# Patient Record
Sex: Female | Born: 1940 | Race: White | Hispanic: No | Marital: Married | Smoking: Former smoker
Health system: Southern US, Community
[De-identification: ages and names within clinical notes are randomized; demographics above are authoritative.]

## PROBLEM LIST (undated history)

## (undated) DIAGNOSIS — M1990 Unspecified osteoarthritis, unspecified site: Secondary | ICD-10-CM

## (undated) DIAGNOSIS — D649 Anemia, unspecified: Secondary | ICD-10-CM

## (undated) DIAGNOSIS — E119 Type 2 diabetes mellitus without complications: Secondary | ICD-10-CM

## (undated) DIAGNOSIS — I10 Essential (primary) hypertension: Secondary | ICD-10-CM

## (undated) DIAGNOSIS — K219 Gastro-esophageal reflux disease without esophagitis: Secondary | ICD-10-CM

## (undated) DIAGNOSIS — E079 Disorder of thyroid, unspecified: Secondary | ICD-10-CM

## (undated) DIAGNOSIS — Z5189 Encounter for other specified aftercare: Secondary | ICD-10-CM

## (undated) HISTORY — DX: Essential (primary) hypertension: I10

## (undated) HISTORY — PX: CARDIAC CATHETERIZATION: SHX172

## (undated) HISTORY — PX: TUBAL LIGATION: SHX77

## (undated) HISTORY — DX: Encounter for other specified aftercare: Z51.89

## (undated) HISTORY — PX: ABDOMINAL HYSTERECTOMY: SHX81

## (undated) HISTORY — DX: Type 2 diabetes mellitus without complications: E11.9

## (undated) HISTORY — DX: Disorder of thyroid, unspecified: E07.9

## (undated) HISTORY — PX: CHOLECYSTECTOMY: SHX55

## (undated) HISTORY — PX: CARDIAC ELECTROPHYSIOLOGY MAPPING AND ABLATION: SHX1292

## (undated) HISTORY — PX: WISDOM TOOTH EXTRACTION: SHX21

## (undated) HISTORY — DX: Anemia, unspecified: D64.9

## (undated) HISTORY — DX: Unspecified osteoarthritis, unspecified site: M19.90

## (undated) HISTORY — DX: Gastro-esophageal reflux disease without esophagitis: K21.9

## (undated) HISTORY — PX: REPLACEMENT TOTAL KNEE: SUR1224

---

## 2005-03-12 ENCOUNTER — Ambulatory Visit (HOSPITAL_COMMUNITY): Admission: RE | Admit: 2005-03-12 | Discharge: 2005-03-12 | Payer: Self-pay | Admitting: Family Medicine

## 2005-03-19 ENCOUNTER — Ambulatory Visit (HOSPITAL_COMMUNITY): Admission: RE | Admit: 2005-03-19 | Discharge: 2005-03-19 | Payer: Self-pay | Admitting: Family Medicine

## 2005-04-01 ENCOUNTER — Inpatient Hospital Stay (HOSPITAL_COMMUNITY): Admission: RE | Admit: 2005-04-01 | Discharge: 2005-04-05 | Payer: Self-pay | Admitting: Orthopedic Surgery

## 2006-01-25 ENCOUNTER — Emergency Department (HOSPITAL_COMMUNITY): Admission: EM | Admit: 2006-01-25 | Discharge: 2006-01-25 | Payer: Self-pay | Admitting: Emergency Medicine

## 2008-01-02 IMAGING — CT CT PELVIS W/ CM
1 of 2 series · 15 of 32 positions shown, 19 images · IV contrast (APPLIED)
Comparison: No comparison CT abdomen.

CLINICAL DATA: Abdominal pain.  Question ? diverticulitis?
ABDOMEN CT WITH CONTRAST:
TECHNIQUE: Multidetector CT imaging of the abdomen was performed following the standard protocol during bolus administration of intravenous contrast.
Contrast:  125 cc Omnipaque 300.
TECHNIQUE: Multidetector CT imaging of the pelvis was performed following the standard protocol during bolus administration of intravenous contrast.

[Series 2: abd_pel 5.0 b40f st · axial · 0.73mm/px · z∈[-360,+4]mm · 15 of 81 slices shown, 19 images]
[im 4/81  soft-tissue]
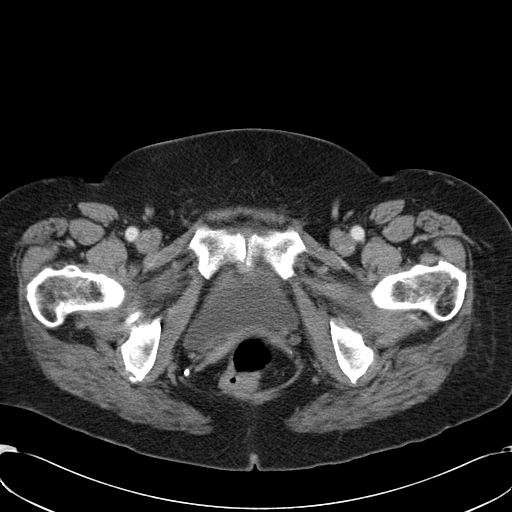
[im 4/81  bone]
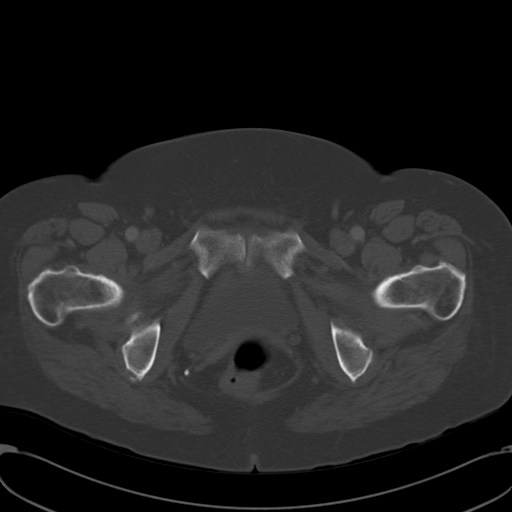
[im 11/81  soft-tissue]
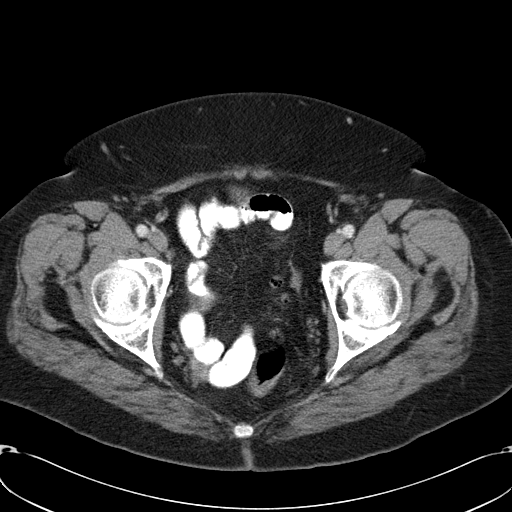
[im 18/81  soft-tissue]
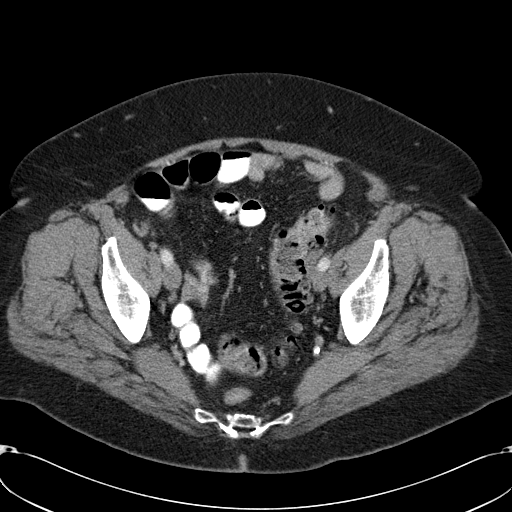
[im 21/81  soft-tissue]
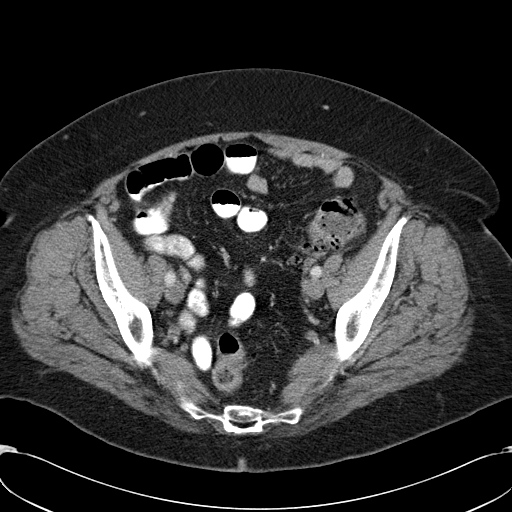
[im 28/81  soft-tissue]
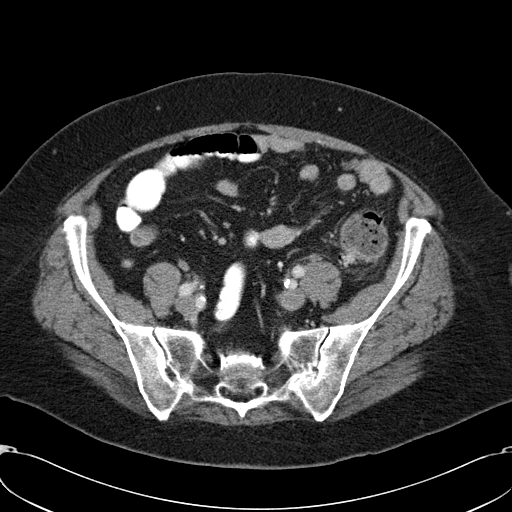
[im 35/81  soft-tissue]
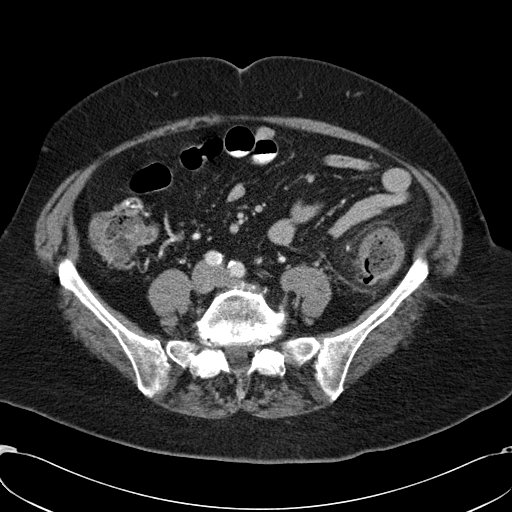
[im 42/81  soft-tissue]
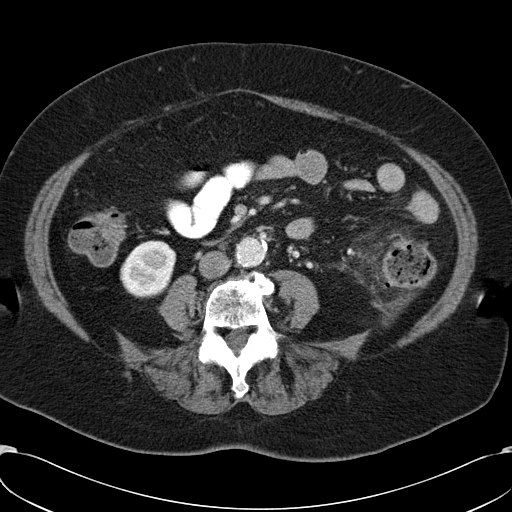
[im 46/81  soft-tissue]
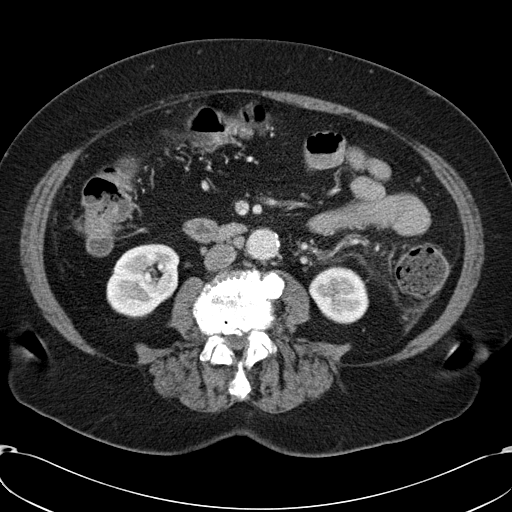
[im 53/81  soft-tissue]
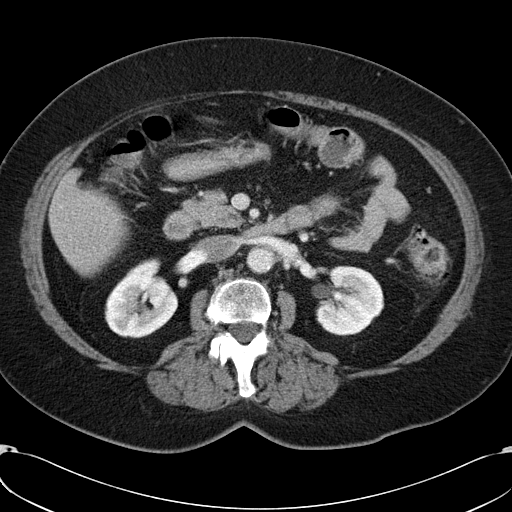
[im 53/81  bone]
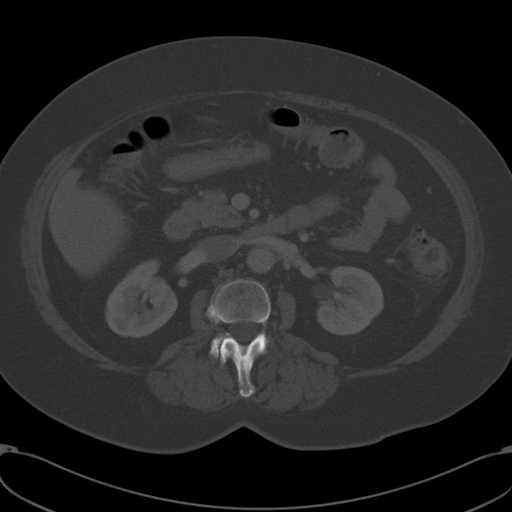
[im 60/81  soft-tissue]
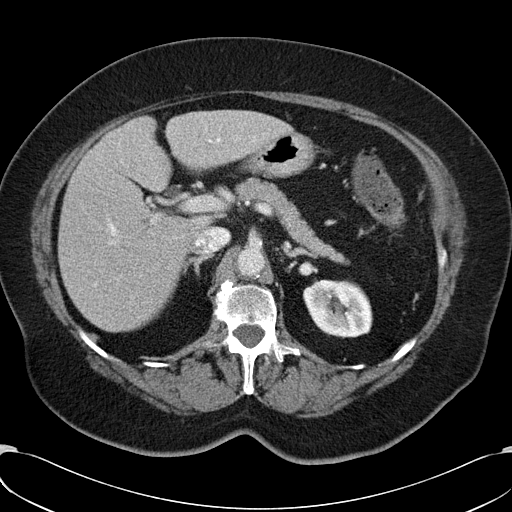
[im 63/81  soft-tissue]
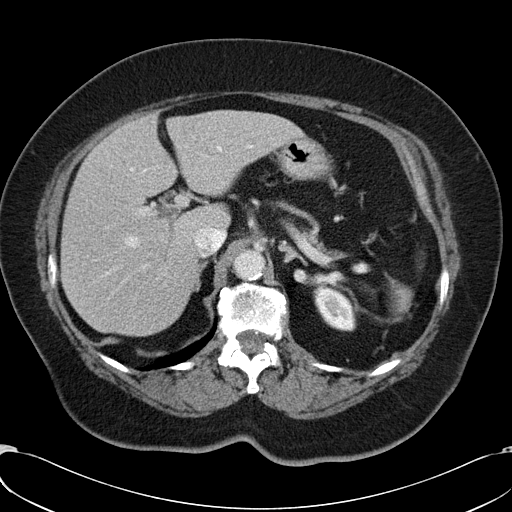
[im 67/81  lung]
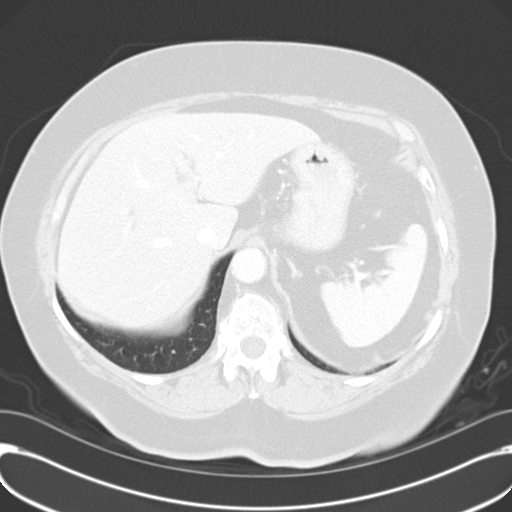
[im 70/81  soft-tissue]
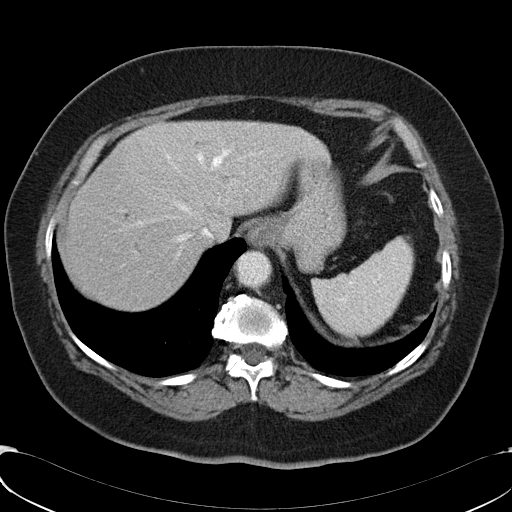
[im 70/81  lung]
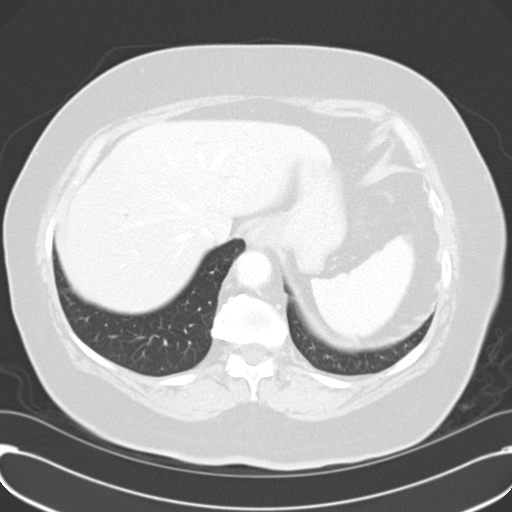
[im 74/81  lung]
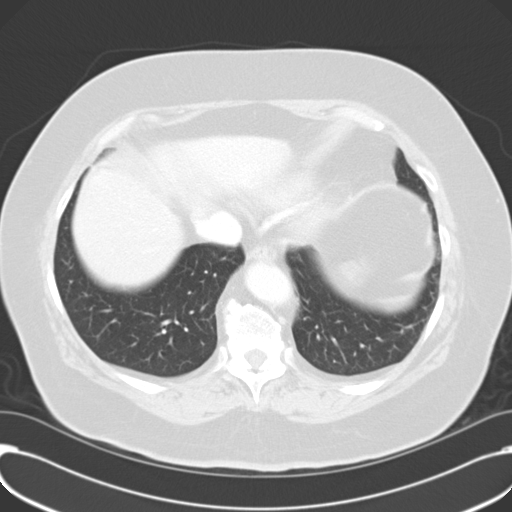
[im 77/81  soft-tissue]
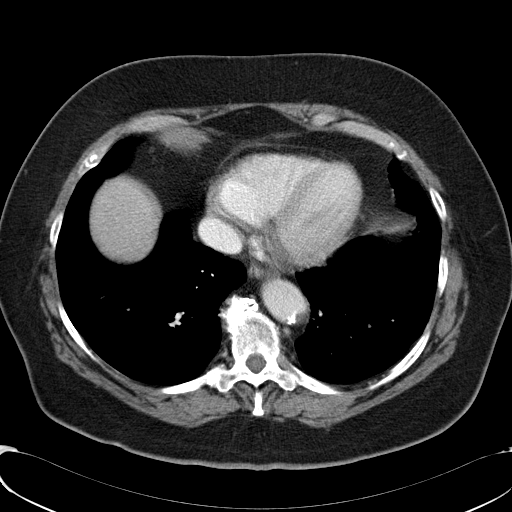
[im 77/81  lung]
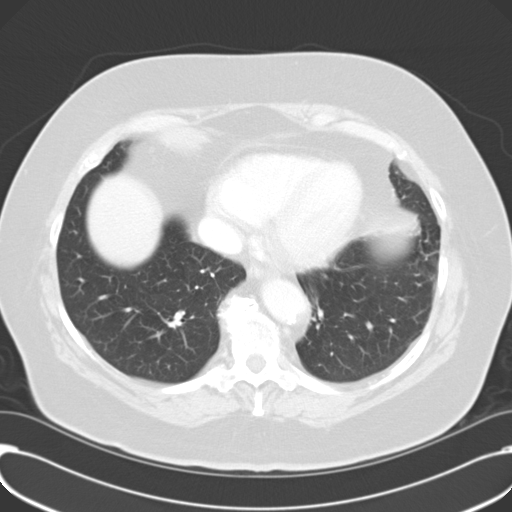

[15 of 32 positions shown; findings below may reference images not displayed]

FINDINGS: Right diaphragmatic fatty containing hernia.  Scarring in lingula.  Subcentimeter low-density lesions in the liver, too small to adequately characterize.  The largest measures 6.3 mm (image #18, series 2) and does not appear to be a simple cyst.  Stability can be confirmed on followup.  No pancreatic, adrenal, renal or splenic lesion.  Aorta is calcified and ectatic with maximal transverse dimension at the level of focal bulge measuring 2.5 x 2.4 cm.  Increased number of normal sized retroperitoneal lymph nodes.  Etiology or significance is indeterminate. 
Diffuse left colon inflammation consistent with diverticulitis without discrete underlying mass identified.  No well defined drainable abscess in the region of the inflammation.  Degenerative changes of the lumbar spine with spinal stenosis are most notable at the L3-4 level.
IMPRESSION: 1. Left colon prominent diverticulitis type changes without drainable abscess. 
2. Focal bulge, lower abdominal aorta, measuring 2.5 cm in maximal transverse dimension.  This is superimposed upon atherosclerotic type changes and ectasia of the abdominal aorta.  
3. Subcentimeter liver lesions of questionable etiology.  Stability can be confirmed on followup.  
4. Right diaphragmatic fatty containing hernia.
5. Increased number of normal sized retroperitoneal lymph nodes of questionable etiology. 
6. Spinal stenosis most notable at L3-4 level.
PELVIS CT WITH CONTRAST:
FINDINGS: Left colon diverticulitis type changes extend toward the pelvis.  No abscess collection.
IMPRESSION: Left colonic diverticulitis extends toward the pelvis but do not reach the level of the sigmoid colon.

## 2020-01-03 ENCOUNTER — Other Ambulatory Visit: Payer: Self-pay | Admitting: Hematology and Oncology

## 2020-01-10 ENCOUNTER — Encounter: Payer: Self-pay | Admitting: Oncology

## 2020-01-13 ENCOUNTER — Other Ambulatory Visit: Payer: Self-pay | Admitting: Oncology

## 2020-01-13 DIAGNOSIS — D509 Iron deficiency anemia, unspecified: Secondary | ICD-10-CM

## 2020-01-13 NOTE — Progress Notes (Signed)
[facility]  [address] [place],  [place]  [address] [phone]  Clinic Day:  01/14/2020  Referring physician: Herma Carson, MD   HISTORY OF PRESENT ILLNESS:  The patient is a 79 y.o. female  who I was asked to consult upon for iron deficiency anemia.  Labs in October 2021 showed a low hemoglobin of 8.7, with iron studies showing a low ferritin of 10, a low serum iron of 13, a TIBC of 436, and a low iron saturation of 2.9%.  She claims to have had a hemoglobin of only 7.2 a few months ago for which she required 2 units of blood.  She recalls being Hemoccult positive, but a recent EGD and colonoscopy did not reveal any obvious source of GI blood loss.  Scans also did not show a potential source/explanation for her iron deficiency anemia.  She denies having any overt forms of blood loss to explain her iron deficiency anemia.  She has been taking 2 iron pills daily for the past 4 weeks.  To her knowledge, there is no family history of anemia or other hematologic disorders.  PAST MEDICAL HISTORY:   Past Medical History:  Diagnosis Date  . Anemia   . Arthritis   . Blood transfusion without reported diagnosis   . Diabetes mellitus without complication (HCC)   . GERD (gastroesophageal reflux disease)   . Hypertension   . Thyroid disease    Atrial fibrillation, macular degeneration, osteoarthritis, hypercholesterolemia  PAST SURGICAL HISTORY:  Fight knee surgery, cholecystectomy, partial hysterectomy, bladder lift surgery    DossCURRENT MEDICATIONS:  Calcium carbonate, multivitamin,, vitamin D3, digoxin, docusate,  Ferrous sulfate, glipizide, levothyroxine, lisinopril, metformin, metoprolol, pioglitazone, rosuvastatin, sotalol, warfarin,    ALLERGIES:  No Known Allergies  FAMILY HISTORY:   Family History  Problem Relation Age of Onset  . Suicidality Mother        DIED FROM SUICIDE  . Alcoholism Father   . Drug abuse Sister        DIED FROM  SEPSIS    SOCIAL HISTORY:  The patient was born and raised in [place].  She lives in town with her husband of 44 years.  She has 6 children, 15 grandchildren, and 5 great grandchildren.  She was a bar maid, bank teller and house cleaner.  She smoked a pack of cigarettes daily x 52 years before quitting 15 years ago.  There is no history of alcohol abuse.    REVIEW OF SYSTEMS:  Review of Systems  Constitutional: Negative for fatigue and fever.  HENT:   Negative for hearing loss and sore throat.   Eyes: Negative for eye problems.  Respiratory: Negative for chest tightness, cough and hemoptysis.   Cardiovascular: Positive for palpitations. Negative for chest pain.  Gastrointestinal: Negative for abdominal distention, abdominal pain, blood in stool, constipation, diarrhea, nausea and vomiting.  Endocrine: Negative for hot flashes.  Genitourinary: Negative for difficulty urinating, dysuria, frequency, hematuria and nocturia.   Musculoskeletal: Positive for arthralgias (knees). Negative for back pain, gait problem and myalgias.  Skin: Positive for rash (sporadic skin lesions are over her abdomen). Negative for itching.  Neurological: Negative.  Negative for dizziness, extremity weakness, gait problem, headaches, light-headedness and numbness.  Hematological: Negative.   Psychiatric/Behavioral: Negative.  Negative for depression and suicidal ideas. The patient is not nervous/anxious.      PHYSICAL EXAM:  Blood pressure (!) 142/83, pulse 86, temperature 97.8 F (36.6 C), temperature source Oral, resp. rate 16, height 5'  7" (1.702 m), weight 223 lb (101.2 kg), SpO2 95 %. Wt Readings from Last 3 Encounters:  01/14/20 223 lb (101.2 kg)   Body mass index is 34.93 kg/m. Performance status (ECOG): 1 Physical Exam Constitutional:      Appearance: Normal appearance. She is not ill-appearing.  HENT:     Mouth/Throat:     Mouth: Mucous membranes are moist.     Pharynx: Oropharynx is clear. No  oropharyngeal exudate or posterior oropharyngeal erythema.  Cardiovascular:     Rate and Rhythm: Normal rate and regular rhythm.     Heart sounds: No murmur heard.  No friction rub. No gallop.   Pulmonary:     Effort: Pulmonary effort is normal. No respiratory distress.     Breath sounds: Normal breath sounds. No wheezing, rhonchi or rales.  Abdominal:     General: Bowel sounds are normal. There is no distension.     Palpations: Abdomen is soft. There is no mass.     Tenderness: There is no abdominal tenderness.  Musculoskeletal:        General: No swelling.     Right lower leg: No edema.     Left lower leg: No edema.  Lymphadenopathy:     Cervical: No cervical adenopathy.     Upper Body:     Right upper body: No supraclavicular or axillary adenopathy.     Left upper body: No supraclavicular or axillary adenopathy.     Lower Body: No right inguinal adenopathy. No left inguinal adenopathy.  Skin:    General: Skin is warm.     Coloration: Skin is not jaundiced.     Findings: No lesion or rash.  Neurological:     General: No focal deficit present.     Mental Status: She is alert and oriented to person, place, and time. Mental status is at baseline.     Cranial Nerves: Cranial nerves are intact.  Psychiatric:        Mood and Affect: Mood normal.        Behavior: Behavior normal.        Thought Content: Thought content normal.    LABS:    ASSESSMENT & PLAN:  A 79 y.o. female who I was asked to consult upon for iron deficiency anemia.  Her hemoglobin of 12.0 is clearly better than what it has been over the past few months.  As mentioned previously, she is taking 1-2 iron tablets daily.   I have no problem with her continuing to do this over these next few months to ensure adequate fortification iron stores.  As mentioned previously, she did have a recent GI workup which came back negative.  Clinically she is doing well.  I will see her back in  4 months for repeat clinical  assessment.  The patient understands all the plans discussed today and is in agreement with them.  I do appreciate Dr. Herma Carson for his new consult.   Johnney Scarlata Kirby Funk, MD

## 2020-01-14 ENCOUNTER — Inpatient Hospital Stay: Payer: Medicare HMO | Admitting: Hematology and Oncology

## 2020-01-14 ENCOUNTER — Inpatient Hospital Stay: Payer: Medicare HMO | Attending: Oncology | Admitting: Oncology

## 2020-01-14 ENCOUNTER — Telehealth: Payer: Self-pay | Admitting: Oncology

## 2020-01-14 ENCOUNTER — Encounter: Payer: Self-pay | Admitting: Oncology

## 2020-01-14 ENCOUNTER — Other Ambulatory Visit: Payer: Self-pay

## 2020-01-14 VITALS — BP 142/83 | HR 86 | Temp 97.8°F | Resp 16 | Ht 67.0 in | Wt 223.0 lb

## 2020-01-14 DIAGNOSIS — D509 Iron deficiency anemia, unspecified: Secondary | ICD-10-CM

## 2020-01-14 LAB — CBC: RBC: 4.49 (ref 3.87–5.11)

## 2020-01-14 LAB — CBC AND DIFFERENTIAL
HCT: 39 (ref 36–46)
Hemoglobin: 12 (ref 12.0–16.0)
Neutrophils Absolute: 2.15
Platelets: 172 (ref 150–399)
WBC: 4.3

## 2020-01-14 NOTE — Telephone Encounter (Signed)
Per 11/12 LOS schedule Labs, Follow up in March '22.  Gave patient appt

## 2020-01-16 ENCOUNTER — Other Ambulatory Visit: Payer: Self-pay | Admitting: Oncology

## 2020-01-16 DIAGNOSIS — D509 Iron deficiency anemia, unspecified: Secondary | ICD-10-CM

## 2020-05-10 ENCOUNTER — Inpatient Hospital Stay: Payer: Medicare HMO | Attending: Oncology

## 2020-05-12 ENCOUNTER — Inpatient Hospital Stay: Payer: Medicare HMO | Admitting: Oncology

## 2021-08-23 ENCOUNTER — Encounter (HOSPITAL_BASED_OUTPATIENT_CLINIC_OR_DEPARTMENT_OTHER): Payer: Self-pay | Admitting: Pediatrics

## 2021-08-23 ENCOUNTER — Emergency Department (HOSPITAL_BASED_OUTPATIENT_CLINIC_OR_DEPARTMENT_OTHER)
Admission: EM | Admit: 2021-08-23 | Discharge: 2021-08-23 | Disposition: A | Discharge status: Home / Self Care | Payer: Medicare HMO | Attending: Emergency Medicine | Admitting: Emergency Medicine

## 2021-08-23 ENCOUNTER — Other Ambulatory Visit: Payer: Self-pay

## 2021-08-23 DIAGNOSIS — K068 Other specified disorders of gingiva and edentulous alveolar ridge: Secondary | ICD-10-CM | POA: Diagnosis present

## 2021-08-23 DIAGNOSIS — Z7902 Long term (current) use of antithrombotics/antiplatelets: Secondary | ICD-10-CM | POA: Diagnosis not present

## 2021-08-23 DIAGNOSIS — I4891 Unspecified atrial fibrillation: Secondary | ICD-10-CM | POA: Insufficient documentation

## 2021-08-23 LAB — PROTIME-INR
INR: 1.2 (ref 0.8–1.2)
Prothrombin Time: 15 seconds (ref 11.4–15.2)

## 2021-08-23 LAB — CBC WITH DIFFERENTIAL/PLATELET
Abs Immature Granulocytes: 0.01 10*3/uL (ref 0.00–0.07)
Basophils Absolute: 0.1 10*3/uL (ref 0.0–0.1)
Basophils Relative: 1 %
Eosinophils Absolute: 0.4 10*3/uL (ref 0.0–0.5)
Eosinophils Relative: 5 %
HCT: 39.4 % (ref 36.0–46.0)
Hemoglobin: 12.7 g/dL (ref 12.0–15.0)
Immature Granulocytes: 0 %
Lymphocytes Relative: 25 %
Lymphs Abs: 1.8 10*3/uL (ref 0.7–4.0)
MCH: 29.7 pg (ref 26.0–34.0)
MCHC: 32.2 g/dL (ref 30.0–36.0)
MCV: 92.3 fL (ref 80.0–100.0)
Monocytes Absolute: 0.8 10*3/uL (ref 0.1–1.0)
Monocytes Relative: 11 %
Neutro Abs: 4.1 10*3/uL (ref 1.7–7.7)
Neutrophils Relative %: 58 %
Platelets: 197 10*3/uL (ref 150–400)
RBC: 4.27 MIL/uL (ref 3.87–5.11)
RDW: 14.6 % (ref 11.5–15.5)
WBC: 7.2 10*3/uL (ref 4.0–10.5)
nRBC: 0 % (ref 0.0–0.2)

## 2021-08-23 LAB — COMPREHENSIVE METABOLIC PANEL
ALT: 11 U/L (ref 0–44)
AST: 20 U/L (ref 15–41)
Albumin: 3.4 g/dL — ABNORMAL LOW (ref 3.5–5.0)
Alkaline Phosphatase: 58 U/L (ref 38–126)
Anion gap: 8 (ref 5–15)
BUN: 27 mg/dL — ABNORMAL HIGH (ref 8–23)
CO2: 25 mmol/L (ref 22–32)
Calcium: 9.6 mg/dL (ref 8.9–10.3)
Chloride: 106 mmol/L (ref 98–111)
Creatinine, Ser: 0.7 mg/dL (ref 0.44–1.00)
GFR, Estimated: >60 mL/min (ref >60)
Glucose, Bld: 111 mg/dL — ABNORMAL HIGH (ref 70–99)
Potassium: 3.9 mmol/L (ref 3.5–5.1)
Sodium: 139 mmol/L (ref 135–145)
Total Bilirubin: 1.1 mg/dL (ref 0.3–1.2)
Total Protein: 7.3 g/dL (ref 6.5–8.1)

## 2021-08-23 NOTE — ED Triage Notes (Signed)
Reported recent dental procedure last Monday and started bleeding from the mouth around 9:30 this morning

## 2021-08-23 NOTE — ED Notes (Signed)
Patient verbalizes understanding of discharge instructions. Opportunity for questioning and answers were provided. Armband removed by staff, pt discharged from ED. Wheeled out to lobby with family  

## 2021-08-23 NOTE — Discharge Instructions (Signed)
If you have any additional bleeding please hold pressure directly to the right-sided screw for at least 10 to 15 minutes without checking.  If you have persistent bleeding again that does not resolve you can return to urgent care, emergency department, or try to get in contact with your dentist.
